# Patient Record
Sex: Female | Born: 1970
Health system: Southern US, Community
[De-identification: ages and names within clinical notes are randomized; demographics above are authoritative.]

## PROBLEM LIST (undated history)

## (undated) DIAGNOSIS — Z800 Family history of malignant neoplasm of digestive organs: Secondary | ICD-10-CM

## (undated) HISTORY — DX: Family history of malignant neoplasm of digestive organs: Z80.0

---

## 1998-01-19 ENCOUNTER — Other Ambulatory Visit: Admission: RE | Admit: 1998-01-19 | Discharge: 1998-01-19 | Payer: Self-pay | Admitting: *Deleted

## 2005-04-20 ENCOUNTER — Ambulatory Visit: Payer: Self-pay | Admitting: Internal Medicine

## 2007-02-08 ENCOUNTER — Ambulatory Visit: Payer: Self-pay | Admitting: Internal Medicine

## 2007-04-07 ENCOUNTER — Emergency Department: Payer: Self-pay | Admitting: Emergency Medicine

## 2007-04-09 ENCOUNTER — Encounter: Payer: Self-pay | Admitting: Maternal & Fetal Medicine

## 2007-10-03 ENCOUNTER — Observation Stay: Payer: Self-pay

## 2007-10-17 ENCOUNTER — Ambulatory Visit: Payer: Self-pay | Admitting: Obstetrics & Gynecology

## 2007-10-18 ENCOUNTER — Inpatient Hospital Stay: Payer: Self-pay | Admitting: Obstetrics & Gynecology

## 2010-06-07 ENCOUNTER — Ambulatory Visit: Payer: Self-pay | Admitting: Obstetrics & Gynecology

## 2015-03-12 ENCOUNTER — Emergency Department
Admission: EM | Admit: 2015-03-12 | Discharge: 2015-03-12 | Disposition: A | Discharge status: Home / Self Care | Payer: BLUE CROSS/BLUE SHIELD | Attending: Emergency Medicine | Admitting: Emergency Medicine

## 2015-03-12 ENCOUNTER — Emergency Department: Payer: BLUE CROSS/BLUE SHIELD

## 2015-03-12 ENCOUNTER — Encounter: Payer: Self-pay | Admitting: Emergency Medicine

## 2015-03-12 DIAGNOSIS — R0602 Shortness of breath: Secondary | ICD-10-CM | POA: Insufficient documentation

## 2015-03-12 DIAGNOSIS — R002 Palpitations: Secondary | ICD-10-CM | POA: Insufficient documentation

## 2015-03-12 DIAGNOSIS — F17210 Nicotine dependence, cigarettes, uncomplicated: Secondary | ICD-10-CM | POA: Insufficient documentation

## 2015-03-12 DIAGNOSIS — F1210 Cannabis abuse, uncomplicated: Secondary | ICD-10-CM | POA: Insufficient documentation

## 2015-03-12 DIAGNOSIS — R079 Chest pain, unspecified: Secondary | ICD-10-CM | POA: Diagnosis not present

## 2015-03-12 DIAGNOSIS — R251 Tremor, unspecified: Secondary | ICD-10-CM | POA: Diagnosis not present

## 2015-03-12 DIAGNOSIS — F419 Anxiety disorder, unspecified: Secondary | ICD-10-CM | POA: Diagnosis present

## 2015-03-12 LAB — BASIC METABOLIC PANEL
Anion gap: 11 (ref 5–15)
BUN: 14 mg/dL (ref 6–20)
CALCIUM: 9.1 mg/dL (ref 8.9–10.3)
CO2: 18 mmol/L — AB (ref 22–32)
CREATININE: 0.88 mg/dL (ref 0.44–1.00)
Chloride: 109 mmol/L (ref 101–111)
GLUCOSE: 80 mg/dL (ref 65–99)
Potassium: 4 mmol/L (ref 3.5–5.1)
Sodium: 138 mmol/L (ref 135–145)

## 2015-03-12 LAB — CBC
HEMATOCRIT: 39.9 % (ref 35.0–47.0)
Hemoglobin: 13.6 g/dL (ref 12.0–16.0)
MCH: 30.9 pg (ref 26.0–34.0)
MCHC: 34 g/dL (ref 32.0–36.0)
MCV: 90.9 fL (ref 80.0–100.0)
PLATELETS: 204 10*3/uL (ref 150–440)
RBC: 4.39 MIL/uL (ref 3.80–5.20)
RDW: 14.3 % (ref 11.5–14.5)
WBC: 4.9 10*3/uL (ref 3.6–11.0)

## 2015-03-12 LAB — URINE DRUG SCREEN, QUALITATIVE (ARMC ONLY)
Amphetamines, Ur Screen: NOT DETECTED
BARBITURATES, UR SCREEN: NOT DETECTED
BENZODIAZEPINE, UR SCRN: NOT DETECTED
CANNABINOID 50 NG, UR ~~LOC~~: POSITIVE — AB
Cocaine Metabolite,Ur ~~LOC~~: NOT DETECTED
MDMA (Ecstasy)Ur Screen: NOT DETECTED
Methadone Scn, Ur: NOT DETECTED
OPIATE, UR SCREEN: NOT DETECTED
PHENCYCLIDINE (PCP) UR S: NOT DETECTED
Tricyclic, Ur Screen: NOT DETECTED

## 2015-03-12 LAB — URINALYSIS COMPLETE WITH MICROSCOPIC (ARMC ONLY)
BILIRUBIN URINE: NEGATIVE
GLUCOSE, UA: NEGATIVE mg/dL
HGB URINE DIPSTICK: NEGATIVE
Ketones, ur: NEGATIVE mg/dL
Leukocytes, UA: NEGATIVE
NITRITE: NEGATIVE
PH: 6 (ref 5.0–8.0)
PROTEIN: NEGATIVE mg/dL
SPECIFIC GRAVITY, URINE: 1.016 (ref 1.005–1.030)

## 2015-03-12 LAB — TSH: TSH: 0.721 u[IU]/mL (ref 0.350–4.500)

## 2015-03-12 MED ORDER — LORAZEPAM 1 MG PO TABS
1.0000 mg | ORAL_TABLET | Freq: Once | ORAL | Status: AC
  Administered 2015-03-12: 1 mg via ORAL
  Filled 2015-03-12: qty 1

## 2015-03-12 NOTE — ED Notes (Signed)
Pt aware of need for urine  

## 2015-03-12 NOTE — ED Provider Notes (Signed)
[facility] Emergency Department Provider Note  ____________________________________________  Time seen: Approximately 10:22 AM  I have reviewed the triage vital signs and the nursing notes.   HISTORY  Chief Complaint Panic Attack    HPI Suzanne Mann is a 44 y.o. female who is otherwise healthy presenting with panic attack. Patient states that she was in her usual state of health, dropped the kids off at school and then in the car began to feel very shaky. She felt like maybe she needed to eat or drink something, so she had breakfast and her symptoms did not improve. She then developed pain in her "boob" that was associated with shortness of breath and worsening feelings of nervousness.She does not have a history of panic attacks, has had no obvious stressor in her life, and has no family history of panic attacks. She states that she is able to slow her breathing down and calm herself down successfully. She does smoke marijuana, last use 2 nights ago and did not have any symptoms at that time. She denies cocaine.   Family history: Negative for anxiety or panic attacks  Social history: Positive for frequent marijuana use. History reviewed. No pertinent past medical history.  There are no active problems to display for this patient.   Past Surgical History  Procedure Laterality Date  . Cesarean section      No current outpatient prescriptions on file.  Allergies Review of patient's allergies indicates no known allergies.  No family history on file.  Social History Social History  Substance Use Topics  . Smoking status: Current Every Day Smoker -- 0.50 packs/day    Types: Cigarettes  . Smokeless tobacco: None  . Alcohol Use: Yes     Comment: occas    Review of Systems Constitutional: No fever/chills Eyes: No visual changes. ENT: No sore throat. Cardiovascular: Positive chest pain, palpitations. Respiratory: Positive shortness of breath.  No  cough. Gastrointestinal: No abdominal pain.  No nausea, no vomiting.  No diarrhea.  No constipation. Genitourinary: Negative for dysuria. Musculoskeletal: Negative for back pain. Skin: Negative for rash. Neurological: Negative for headaches, focal weakness or numbness.  10-point ROS otherwise negative.  ____________________________________________   PHYSICAL EXAM:  VITAL SIGNS: ED Triage Vitals  Enc Vitals Group     BP 03/12/15 0944 149/91 mmHg     Pulse Rate 03/12/15 0944 90     Resp 03/12/15 0944 18     Temp 03/12/15 0944 97.7 F (36.5 C)     Temp Source 03/12/15 0944 Oral     SpO2 03/12/15 0944 100 %     Weight 03/12/15 0944 134 lb (60.782 kg)     Height 03/12/15 0944 5\' 6"  (1.676 m)     Head Cir --      Peak Flow --      Pain Score 03/12/15 0944 5     Pain Loc --      Pain Edu? --      Excl. in [place]? --     Constitutional: Patient is alert and oriented and answering questions appropriate. She is anxious and appears shaky.  Eyes: Conjunctivae are normal.  EOMI.No scleral icterus.  Head: Atraumatic. Nose: No congestion/rhinnorhea. Mouth/Throat: Mucous membranes are moist.  Neck: No stridor.  Supple.  No palpable goiter.  Cardiovascular: Normal rate, regular rhythm. No murmurs, rubs or gallops.  Respiratory:Patient is tachypnea without accessory muscle use or retractions. She is able to speak in full sentences. She is able to slow her  breathing down. Lungs CTAB.  No wheezes, rales or ronchi. Gastrointestinal: Soft and nontender. No distention. No peritoneal signs. Musculoskeletal: No LE edema.  Neurologic:  Normal speech and language. No gross focal neurologic deficits are appreciated.  Skin:  Skin is warm, dry and intact. No rash noted. Psychiatric: Anxious mood with normal affect. Speech and behavior are normal.  Normal judgement and insight  ____________________________________________   LABS (all labs ordered are listed, but only abnormal results are  displayed)  Labs Reviewed  BASIC METABOLIC PANEL - Abnormal; Notable for the following:    CO2 18 (*)    All other components within normal limits  URINALYSIS COMPLETEWITH MICROSCOPIC ([facility] ONLY) - Abnormal; Notable for the following:    Color, Urine YELLOW (*)    APPearance CLEAR (*)    Bacteria, UA RARE (*)    Squamous Epithelial / LPF 6-30 (*)    All other components within normal limits  URINE DRUG SCREEN, QUALITATIVE ([facility] ONLY) - Abnormal; Notable for the following:    Cannabinoid 50 Ng, Ur Axis POSITIVE (*)    All other components within normal limits  TSH  CBC  POC URINE PREG, ED   ____________________________________________  EKG  ED ECG REPORT I, Eula Listen, the attending physician, personally viewed and interpreted this ECG.   Date: 03/12/2015  EKG Time: 1034  Rate: 60  Rhythm: normal sinus rhythm  Axis: Normal  Intervals:none  ST&T Change: No ST elevation. Her baseline tracing but this is likely due to the patient's tremulousness from her anxiety.  ____________________________________________  RADIOLOGY  Dg Chest 2 View  03/12/2015  CLINICAL DATA:  Shortness of breath.  Smoker. EXAM: CHEST  2 VIEW COMPARISON:  None. FINDINGS: EKG leads create extensive artifact over the chest. There is hyperinflation and central airway thickening correlating with smoking history. There is no edema, consolidation, effusion, or pneumothorax. Normal heart size and aortic contours. IMPRESSION: 1. No acute finding. 2. Hyperinflation, likely COPD. Electronically Signed   By: Monte Fantasia M.D.   On: 03/12/2015 11:31    ____________________________________________   PROCEDURES  Procedure(s) performed: None  Critical Care performed: No ____________________________________________   INITIAL IMPRESSION / ASSESSMENT AND PLAN / ED COURSE  Pertinent labs & imaging results that were available during my care of the patient were reviewed by me and considered in my  medical decision making (see chart for details).  44 y.o. female, otherwise healthy presenting with feelings of anxiety associated with left boob pain, shortness of breath. Clinically, the patient's symptoms are consistent with anxiety or panic attack, however she does have not have any psychiatric history and does not describe any acute stressors which have could have caused this. I will do a medical examination to rule out thyroid disorder, Y abnormality or acute cardiac event. If the workup is negative she will need follow-up with her primary care physician.  At the time of discharge, the patient was feeling much better. All her feelings of anxiety had resolved. She was no longer having any chest pain or shortness of breath. Her workup was negative for any organic causes of chest pain or shortness of breath. She was discharged with instructions for close PMD follow-up. She understands return precautions as well as follow-up instructions. ____________________________________________  FINAL CLINICAL IMPRESSION(S) / ED DIAGNOSES  Final diagnoses:  Anxiety  Chest pain, unspecified chest pain type  Shakiness      NEW MEDICATIONS STARTED DURING THIS VISIT:  There are no discharge medications for this patient.  Eula Listen, MD 03/12/15 1544

## 2015-03-12 NOTE — ED Notes (Signed)
Discussed discharge instructions and follow-up care with patient. No questions or concerns at this time. Pt stable at discharge.  

## 2015-03-12 NOTE — Discharge Instructions (Signed)
Please call the [facility] clinic to establish a primary care physician follow-up the symptoms that he had today.  Please return to the emergency department if you develop chest pain, shortness of breath, a panic attack, fever, palpitations, or any other symptoms concerning to you.

## 2015-03-12 NOTE — ED Notes (Signed)
C/o left breast pain and right arm pain when breathing is fast.  Feels like she is having an anxiety attack.  Pain eases when breathing controlled. No hx anxiety or depression. Has never had anxiety attack.  Reports just hasn't been feeling right today.

## 2015-03-12 NOTE — ED Notes (Signed)
Pt states she began feeling sob and anxious this am after dropping her kids off at school. Was sob, "hyperventilating" upon initial assessment. Pt states the back of her neck hurts-husband states she has been really tense. Husband supportive in triage. Pt calming down with proper breathing.

## 2015-03-20 LAB — POCT PREGNANCY, URINE: Preg Test, Ur: NEGATIVE

## 2016-07-01 ENCOUNTER — Encounter: Payer: Self-pay | Admitting: Emergency Medicine

## 2016-07-01 ENCOUNTER — Emergency Department
Admission: EM | Admit: 2016-07-01 | Discharge: 2016-07-01 | Disposition: A | Discharge status: Home / Self Care | Payer: BLUE CROSS/BLUE SHIELD | Attending: Emergency Medicine | Admitting: Emergency Medicine

## 2016-07-01 ENCOUNTER — Emergency Department: Payer: BLUE CROSS/BLUE SHIELD

## 2016-07-01 DIAGNOSIS — J209 Acute bronchitis, unspecified: Secondary | ICD-10-CM | POA: Insufficient documentation

## 2016-07-01 DIAGNOSIS — Z87891 Personal history of nicotine dependence: Secondary | ICD-10-CM | POA: Diagnosis not present

## 2016-07-01 DIAGNOSIS — R05 Cough: Secondary | ICD-10-CM | POA: Diagnosis present

## 2016-07-01 LAB — CBC WITH DIFFERENTIAL/PLATELET
BASOS ABS: 0 10*3/uL (ref 0–0.1)
Basophils Relative: 1 %
Eosinophils Absolute: 0.2 10*3/uL (ref 0–0.7)
Eosinophils Relative: 3 %
HEMATOCRIT: 39.3 % (ref 35.0–47.0)
Hemoglobin: 13.6 g/dL (ref 12.0–16.0)
LYMPHS PCT: 15 %
Lymphs Abs: 1 10*3/uL (ref 1.0–3.6)
MCH: 31.4 pg (ref 26.0–34.0)
MCHC: 34.5 g/dL (ref 32.0–36.0)
MCV: 91.2 fL (ref 80.0–100.0)
MONO ABS: 0.5 10*3/uL (ref 0.2–0.9)
Monocytes Relative: 7 %
NEUTROS ABS: 5.2 10*3/uL (ref 1.4–6.5)
Neutrophils Relative %: 74 %
Platelets: 204 10*3/uL (ref 150–440)
RBC: 4.31 MIL/uL (ref 3.80–5.20)
RDW: 14.1 % (ref 11.5–14.5)
WBC: 6.9 10*3/uL (ref 3.6–11.0)

## 2016-07-01 LAB — TROPONIN I: Troponin I: <0.03 ng/mL (ref <0.03)

## 2016-07-01 LAB — BASIC METABOLIC PANEL
ANION GAP: 8 (ref 5–15)
BUN: 9 mg/dL (ref 6–20)
CHLORIDE: 106 mmol/L (ref 101–111)
CO2: 25 mmol/L (ref 22–32)
Calcium: 9.1 mg/dL (ref 8.9–10.3)
Creatinine, Ser: 0.8 mg/dL (ref 0.44–1.00)
GFR calc Af Amer: >60 mL/min (ref >60)
GLUCOSE: 89 mg/dL (ref 65–99)
POTASSIUM: 3.9 mmol/L (ref 3.5–5.1)
Sodium: 139 mmol/L (ref 135–145)

## 2016-07-01 MED ORDER — ALBUTEROL SULFATE HFA 108 (90 BASE) MCG/ACT IN AERS: 2.0000 | INHALATION_SPRAY | Freq: Four times a day (QID) | RESPIRATORY_TRACT | 0 refills | Status: DC | PRN

## 2016-07-01 MED ORDER — PREDNISONE 10 MG PO TABS: ORAL_TABLET | ORAL | 0 refills | Status: DC

## 2016-07-01 MED ORDER — PREDNISONE 20 MG PO TABS
40.0000 mg | ORAL_TABLET | Freq: Once | ORAL | Status: AC
  Administered 2016-07-01: 40 mg via ORAL
  Filled 2016-07-01: qty 2

## 2016-07-01 MED ORDER — IPRATROPIUM-ALBUTEROL 0.5-2.5 (3) MG/3ML IN SOLN
3.0000 mL | Freq: Once | RESPIRATORY_TRACT | Status: AC
  Administered 2016-07-01: 3 mL via RESPIRATORY_TRACT

## 2016-07-01 MED ORDER — IPRATROPIUM-ALBUTEROL 0.5-2.5 (3) MG/3ML IN SOLN
3.0000 mL | Freq: Once | RESPIRATORY_TRACT | Status: AC
  Administered 2016-07-01: 3 mL via RESPIRATORY_TRACT
  Filled 2016-07-01: qty 3

## 2016-07-01 MED ORDER — IPRATROPIUM-ALBUTEROL 0.5-2.5 (3) MG/3ML IN SOLN
RESPIRATORY_TRACT | Status: AC
  Administered 2016-07-01: 3 mL via RESPIRATORY_TRACT
  Filled 2016-07-01: qty 3

## 2016-07-01 NOTE — ED Notes (Signed)
Pt SpO2 consistent 94% while ambulating in hall. Pt denies dizziness or lightheadedness while walking.

## 2016-07-01 NOTE — ED Provider Notes (Signed)
[facility] Emergency Department Provider Note ____________________________________________   I have reviewed the triage vital signs and the triage nursing note.  HISTORY  Chief Complaint Cough; Nasal Congestion; and Shortness of Breath   Historian Patient  HPI Suzanne Mann is a 46 y.o. female presenting for dyspnea and wheezing and trouble breathing. Nonproductive cough. Symptoms for several days now almost week. States that she was sent over from walk-in clinic because her O2 sat was low, around 91% on room air.  No history wheezing or asthma. She has a history of smoking previously, she currently is a nonsmoker.  No fever. No nausea or vomiting or diarrhea. No abdominal pain. No chest pain.    History reviewed. No pertinent past medical history.  There are no active problems to display for this patient.   Past Surgical History:  Procedure Laterality Date  . CESAREAN SECTION    . CESAREAN SECTION      Prior to Admission medications   Medication Sig Start Date End Date Taking? Authorizing Provider  albuterol (PROVENTIL HFA;VENTOLIN HFA) 108 (90 Base) MCG/ACT inhaler Inhale 2 puffs into the lungs every 6 (six) hours as needed for wheezing or shortness of breath. 07/01/16   Lisa Roca, MD  predniSONE (DELTASONE) 10 MG tablet 40mg  daily for 4 more days 07/01/16   Lisa Roca, MD    No Known Allergies  No family history on file.  Social History Social History  Substance Use Topics  . Smoking status: Former Smoker    Packs/day: 0.50    Types: Cigarettes  . Smokeless tobacco: Not on file  . Alcohol use Yes     Comment: occas    Review of Systems  Constitutional: Negative for fever. Eyes: Negative for visual changes. ENT: Negative for sore throat. Cardiovascular: Negative for chest pain. Respiratory: Positive for shortness of breath. Gastrointestinal: Negative for abdominal pain, vomiting and diarrhea. Genitourinary: Negative for  dysuria. Musculoskeletal: Negative for back pain. Skin: Negative for rash. Neurological: Negative for headache. 10 point Review of Systems otherwise negative ____________________________________________   PHYSICAL EXAM:  VITAL SIGNS: ED Triage Vitals  Enc Vitals Group     BP 07/01/16 0915 (!) 141/90     Pulse Rate 07/01/16 0915 87     Resp 07/01/16 0915 20     Temp 07/01/16 0915 98.4 F (36.9 C)     Temp Source 07/01/16 0915 Oral     SpO2 07/01/16 0915 95 %     Weight 07/01/16 0916 137 lb (62.1 kg)     Height 07/01/16 0916 5\' 6"  (1.676 m)     Head Circumference --      Peak Flow --      Pain Score 07/01/16 0914 3     Pain Loc --      Pain Edu? --      Excl. in [place]? --      Constitutional: Alert and oriented. Well appearing and in no distress. HEENT   Head: Normocephalic and atraumatic.      Eyes: Conjunctivae are normal. PERRL. Normal extraocular movements.      Ears:         Nose: No congestion/rhinnorhea.   Mouth/Throat: Mucous membranes are moist.   Neck: No stridor. Cardiovascular/Chest: Normal rate, regular rhythm.  No murmurs, rubs, or gallops. Respiratory: Normal respiratory effort without tachypnea nor retractions. Moderate end-expiratory wheezing in all fields. Mild rhonchi without rales. Gastrointestinal: Soft. No distention, no guarding, no rebound. Nontender.    Genitourinary/rectal:Deferred Musculoskeletal: Nontender  with normal range of motion in all extremities. No joint effusions.  No lower extremity tenderness.  No edema. Neurologic:  Normal speech and language. No gross or focal neurologic deficits are appreciated. Skin:  Skin is warm, dry and intact. No rash noted. Psychiatric: Mood and affect are normal. Speech and behavior are normal. Patient exhibits appropriate insight and judgment.   ____________________________________________  LABS (pertinent positives/negatives)  Labs Reviewed  BASIC METABOLIC PANEL  CBC WITH  DIFFERENTIAL/PLATELET  TROPONIN I    ____________________________________________    EKG I, Lisa Roca, MD, the attending physician have personally viewed and interpreted all ECGs.  86 bpm. Normal sinus rhythm. Narrow QRS. Normal axis. No ST and T-wave ____________________________________________  RADIOLOGY All Xrays were viewed by me. Imaging interpreted by Radiologist.  Chest x-ray two-view: No acute cardio pulmonary disease. __________________________________________  PROCEDURES  Procedure(s) performed: None  Critical Care performed: None  ____________________________________________   ED COURSE / ASSESSMENT AND PLAN  Pertinent labs & imaging results that were available during my care of the patient were reviewed by me and considered in my medical decision making (see chart for details).   Ms. Lozada is here with shortness of breath and found to have wheezing and O2 sat around 91%. Improvement sound with DuoNeb and a second DuoNeb. I'm also going to put her on a burst of steroid.  She reported some occasional chest discomfort centrally, it sounds to me most likely related to the bronchospasm and tightness, however I did check laboratory studies in addition to the EKG.  Laboratory studies EKG and chest x-ray are all reassuring overall.  Walking O2 sat reassuring. OK for discharge home - referred to [facility], states pcp has been her ob gyn.  CONSULTATIONS:  None   Patient / Family / Caregiver informed of clinical course, medical decision-making process, and agree with plan.   I discussed return precautions, follow-up instructions, and discharge instructions with patient and/or family.  Discharge instructions:  You were evaluated for wheezing trouble breathing and are being treated for wheezing also called bronco spasm home with bronchitis.  Return to the emergency department immediately for any worsening trouble breathing, shortness breath, chest pain, pain with  breathing, fever, heart racing, dizziness or passing out, weakness, numbness, or any other symptoms concerning to you.  ___________________________________________   FINAL CLINICAL IMPRESSION(S) / ED DIAGNOSES   Final diagnoses:  Bronchospasm with bronchitis, acute              Note: This dictation was prepared with Dragon dictation. Any transcriptional errors that result from this process are unintentional    Lisa Roca, MD 07/01/16 [phone]

## 2016-07-01 NOTE — ED Triage Notes (Signed)
Patient presents to the ED from [facility] for shortness of breath since yesterday evening after having cold/congestion symptoms for a few days.  Patient states, "I thought it was sinuses, but last night I had trouble catching my breath."  Patient is speaking in somewhat short sentences but is showing no signs of distress at this time.  Patient is tachypnic.

## 2016-07-01 NOTE — ED Notes (Signed)
Pt reports productive cough, chest and nasal congestion, and shortness of breath. Pt states that last night she started having trouble catching her breath. Pt has tried OTC medication without relief.

## 2016-07-01 NOTE — Discharge Instructions (Signed)
You were evaluated for wheezing trouble breathing and are being treated for wheezing also called bronco spasm home with bronchitis.  Return to the emergency department immediately for any worsening trouble breathing, shortness breath, chest pain, pain with breathing, fever, heart racing, dizziness or passing out, weakness, numbness, or any other symptoms concerning to you.

## 2018-03-26 ENCOUNTER — Other Ambulatory Visit: Payer: Self-pay

## 2018-03-26 ENCOUNTER — Emergency Department
Admission: EM | Admit: 2018-03-26 | Discharge: 2018-03-26 | Disposition: A | Discharge status: Home / Self Care | Payer: BLUE CROSS/BLUE SHIELD | Attending: Emergency Medicine | Admitting: Emergency Medicine

## 2018-03-26 ENCOUNTER — Encounter: Payer: Self-pay | Admitting: Emergency Medicine

## 2018-03-26 ENCOUNTER — Emergency Department: Payer: BLUE CROSS/BLUE SHIELD

## 2018-03-26 DIAGNOSIS — F17210 Nicotine dependence, cigarettes, uncomplicated: Secondary | ICD-10-CM | POA: Insufficient documentation

## 2018-03-26 DIAGNOSIS — N200 Calculus of kidney: Secondary | ICD-10-CM | POA: Insufficient documentation

## 2018-03-26 DIAGNOSIS — R109 Unspecified abdominal pain: Secondary | ICD-10-CM | POA: Diagnosis present

## 2018-03-26 LAB — BASIC METABOLIC PANEL
Anion gap: 11 (ref 5–15)
BUN: 11 mg/dL (ref 6–20)
CALCIUM: 9.8 mg/dL (ref 8.9–10.3)
CO2: 22 mmol/L (ref 22–32)
Chloride: 103 mmol/L (ref 98–111)
Creatinine, Ser: 0.97 mg/dL (ref 0.44–1.00)
GFR calc Af Amer: >60 mL/min (ref >60)
GFR calc non Af Amer: >60 mL/min (ref >60)
GLUCOSE: 112 mg/dL — AB (ref 70–99)
Potassium: 3.3 mmol/L — ABNORMAL LOW (ref 3.5–5.1)
Sodium: 136 mmol/L (ref 135–145)

## 2018-03-26 LAB — URINALYSIS, COMPLETE (UACMP) WITH MICROSCOPIC
BILIRUBIN URINE: NEGATIVE
Glucose, UA: 150 mg/dL — AB
Ketones, ur: NEGATIVE mg/dL
Leukocytes, UA: NEGATIVE
Nitrite: NEGATIVE
PROTEIN: NEGATIVE mg/dL
Specific Gravity, Urine: 1.01 (ref 1.005–1.030)
pH: 7 (ref 5.0–8.0)

## 2018-03-26 LAB — CBC
HCT: 43.9 % (ref 36.0–46.0)
Hemoglobin: 14.9 g/dL (ref 12.0–15.0)
MCH: 29.8 pg (ref 26.0–34.0)
MCHC: 33.9 g/dL (ref 30.0–36.0)
MCV: 87.8 fL (ref 80.0–100.0)
Platelets: 274 10*3/uL (ref 150–400)
RBC: 5 MIL/uL (ref 3.87–5.11)
RDW: 14 % (ref 11.5–15.5)
WBC: 7.3 10*3/uL (ref 4.0–10.5)
nRBC: 0 % (ref 0.0–0.2)

## 2018-03-26 LAB — POCT PREGNANCY, URINE: Preg Test, Ur: NEGATIVE

## 2018-03-26 MED ORDER — SODIUM CHLORIDE 0.9 % IV BOLUS
1000.0000 mL | Freq: Once | INTRAVENOUS | Status: AC
  Administered 2018-03-26: 1000 mL via INTRAVENOUS

## 2018-03-26 MED ORDER — NAPROXEN 500 MG PO TABS: 500.0000 mg | ORAL_TABLET | Freq: Two times a day (BID) | ORAL | 2 refills | Status: DC

## 2018-03-26 MED ORDER — ONDANSETRON HCL 4 MG/2ML IJ SOLN
INTRAMUSCULAR | Status: AC
  Administered 2018-03-26: 4 mg via INTRAVENOUS
  Filled 2018-03-26: qty 2

## 2018-03-26 MED ORDER — HYDROCODONE-ACETAMINOPHEN 5-325 MG PO TABS: 1.0000 | ORAL_TABLET | ORAL | 0 refills | Status: DC | PRN

## 2018-03-26 MED ORDER — CEPHALEXIN 500 MG PO CAPS: 500.0000 mg | ORAL_CAPSULE | Freq: Two times a day (BID) | ORAL | 0 refills | Status: DC

## 2018-03-26 MED ORDER — FENTANYL CITRATE (PF) 100 MCG/2ML IJ SOLN
INTRAMUSCULAR | Status: AC
  Administered 2018-03-26: 50 ug via INTRAVENOUS
  Filled 2018-03-26: qty 2

## 2018-03-26 MED ORDER — KETOROLAC TROMETHAMINE 30 MG/ML IJ SOLN
30.0000 mg | Freq: Once | INTRAMUSCULAR | Status: AC
  Administered 2018-03-26: 30 mg via INTRAVENOUS
  Filled 2018-03-26: qty 1

## 2018-03-26 MED ORDER — ONDANSETRON HCL 4 MG/2ML IJ SOLN
4.0000 mg | Freq: Once | INTRAMUSCULAR | Status: AC
  Administered 2018-03-26: 4 mg via INTRAVENOUS

## 2018-03-26 MED ORDER — FENTANYL CITRATE (PF) 100 MCG/2ML IJ SOLN
50.0000 ug | Freq: Once | INTRAMUSCULAR | Status: AC
  Administered 2018-03-26: 50 ug via INTRAVENOUS

## 2018-03-26 NOTE — ED Triage Notes (Signed)
Pt here with c/o left flank pain for 2 days now, today however, much worse. States blood in her urine this am and burning with urination. Tearful in triage, denies hx of kidney stones.

## 2018-03-26 NOTE — ED Provider Notes (Signed)
[facility] Emergency Department Provider Note   ____________________________________________    I have reviewed the triage vital signs and the nursing notes.   HISTORY  Chief Complaint Flank Pain     HPI Suzanne Mann is a 47 y.o. female who presents with complaints of left-sided flank pain.  Patient reports symptoms started yesterday as an aching sensation in her left flank.  The pain has significantly worsened today with radiation into her left groin.  She denies hematuria today.  Denies dysuria.  No fevers or chills.  No nausea or vomiting.  Has not taken anything for this.  No history of kidney stones.  Nothing seems to make it worse   History reviewed. No pertinent past medical history.  There are no active problems to display for this patient.   Past Surgical History:  Procedure Laterality Date  . CESAREAN SECTION    . CESAREAN SECTION      Prior to Admission medications   Medication Sig Start Date End Date Taking? Authorizing Provider  albuterol (PROVENTIL HFA;VENTOLIN HFA) 108 (90 Base) MCG/ACT inhaler Inhale 2 puffs into the lungs every 6 (six) hours as needed for wheezing or shortness of breath. 07/01/16   Lisa Roca, MD  cephALEXin (KEFLEX) 500 MG capsule Take 1 capsule (500 mg total) by mouth 2 (two) times daily. 03/26/18   Lavonia Drafts, MD  HYDROcodone-acetaminophen (NORCO/VICODIN) 5-325 MG tablet Take 1 tablet by mouth every 4 (four) hours as needed for moderate pain. 03/26/18   Lavonia Drafts, MD  naproxen (NAPROSYN) 500 MG tablet Take 1 tablet (500 mg total) by mouth 2 (two) times daily with a meal. 03/26/18   Lavonia Drafts, MD  predniSONE (DELTASONE) 10 MG tablet 40mg  daily for 4 more days 07/01/16   Lisa Roca, MD     Allergies Patient has no known allergies.  No family history on file.  Social History Social History   Tobacco Use  . Smoking status: Current Every Day Smoker    Packs/day: 0.50    Types:  Cigarettes  . Smokeless tobacco: Never Used  Substance Use Topics  . Alcohol use: Yes    Comment: occas  . Drug use: Not on file    Review of Systems  Constitutional: No fever/chills Eyes: No visual changes.  ENT: No sore throat. Cardiovascular: Denies chest pain. Respiratory: Denies shortness of breath. Gastrointestinal: As above Genitourinary: No dysuria, no frequency, positive hematuria Musculoskeletal: Negative for back pain. Skin: Negative for rash. Neurological: Negative for headaches    ____________________________________________   PHYSICAL EXAM:  VITAL SIGNS: ED Triage Vitals  Enc Vitals Group     BP 03/26/18 1546 (!) 168/97     Pulse Rate 03/26/18 1546 76     Resp 03/26/18 1546 18     Temp 03/26/18 1546 (!) 97.5 F (36.4 C)     Temp Source 03/26/18 1546 Oral     SpO2 03/26/18 1546 99 %     Weight 03/26/18 1547 63.5 kg (140 lb)     Height 03/26/18 1547 1.676 m (5\' 6" )     Head Circumference --      Peak Flow --      Pain Score 03/26/18 1547 10     Pain Loc --      Pain Edu? --      Excl. in [place]? --     Constitutional: Alert and oriented. No acute distress. Eyes: Conjunctivae are normal.   Nose: No congestion/rhinnorhea. Mouth/Throat: Mucous membranes are  moist.    Cardiovascular: Normal rate, regular rhythm.  Good peripheral circulation. Respiratory: Normal respiratory effort.  No retractions.  Gastrointestinal: Soft and nontender. No distention.  No CVA tenderness.  Musculoskeletal:  Warm and well perfused Neurologic:  Normal speech and language. No gross focal neurologic deficits are appreciated.  Skin:  Skin is warm, dry and intact. No rash noted. Psychiatric: Mood and affect are normal. Speech and behavior are normal.  ____________________________________________   LABS (all labs ordered are listed, but only abnormal results are displayed)  Labs Reviewed  URINALYSIS, COMPLETE (UACMP) WITH MICROSCOPIC - Abnormal; Notable for the following  components:      Result Value   Color, Urine YELLOW (*)    APPearance HAZY (*)    Glucose, UA 150 (*)    Hgb urine dipstick LARGE (*)    RBC / HPF >50 (*)    Bacteria, UA MANY (*)    All other components within normal limits  BASIC METABOLIC PANEL - Abnormal; Notable for the following components:   Potassium 3.3 (*)    Glucose, Bld 112 (*)    All other components within normal limits  CBC  POC URINE PREG, ED  POCT PREGNANCY, URINE   ____________________________________________  EKG  None ____________________________________________  RADIOLOGY  T renal stone study ____________________________________________   PROCEDURES  Procedure(s) performed: No  Procedures   Critical Care performed: No ____________________________________________   INITIAL IMPRESSION / ASSESSMENT AND PLAN / ED COURSE  Pertinent labs & imaging results that were available during my care of the patient were reviewed by me and considered in my medical decision making (see chart for details).  Patient presents with left flank pain rating to her left groin, strongly suspicious for ureterolithiasis.  Will give IV Toradol along with the IV fentanyl she has already received.  Check labs, urinalysis obtain CT renal stone study and reevaluate.  CT scan demonstrates 4 mm left UVJ stone.  Overall the patient is quite well-appearing in no acute distress, normal white blood cell count, urinalysis demonstrates 21-50 white blood cells negative leukocytes negative nitrites positive bacteria, discussed with Dr. Gloriann Loan of urology who recommends discharge with p.o. Keflex.  Patient continues to feel well, strict return precautions discussed including chills, fever, worsening pain     ____________________________________________   FINAL CLINICAL IMPRESSION(S) / ED DIAGNOSES  Final diagnoses:  Kidney stone        Note:  This document was prepared using Dragon voice recognition software and may include  unintentional dictation errors.    Lavonia Drafts, MD 03/26/18 [phone]

## 2018-03-26 NOTE — ED Notes (Signed)
Pt verbalizes understanding of d/c instructions, medications and follow up 

## 2018-03-26 NOTE — ED Notes (Signed)
Pt reports Saturday started with pain to left lower back that now radiates around to her left flank and left groin. Pt reports some nausea as well.

## 2018-05-04 ENCOUNTER — Encounter

## 2018-05-04 ENCOUNTER — Ambulatory Visit: Payer: Self-pay | Admitting: Urology

## 2020-05-01 IMAGING — CT CT RENAL STONE PROTOCOL
2 of 4 series · 16 of 46 positions shown, 18 images · non-contrast
Comparison: None.

CLINICAL DATA: 47-year-old female with left flank pain for 2 days,
today much worse. Blood in urine. Burning with urination. Initial
encounter.

EXAM:
CT ABDOMEN AND PELVIS WITHOUT CONTRAST
TECHNIQUE: Multidetector CT imaging of the abdomen and pelvis was performed
following the standard protocol without IV contrast.

[Series 2: stone full standard · axial · 0.65mm/px · z∈[-413,-3]mm · 13 of 90 slices shown, 15 images]
[im 4/90  soft-tissue]
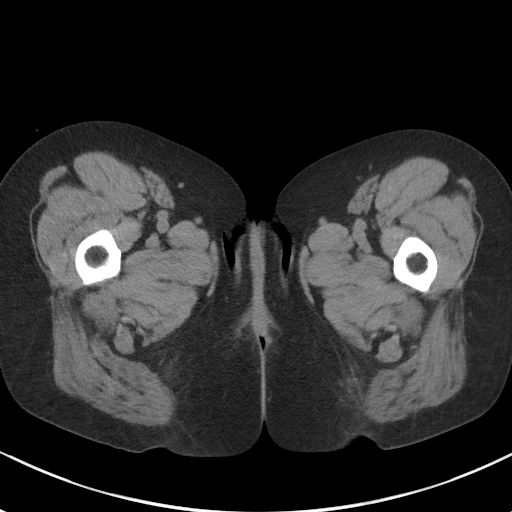
[im 4/90  bone]
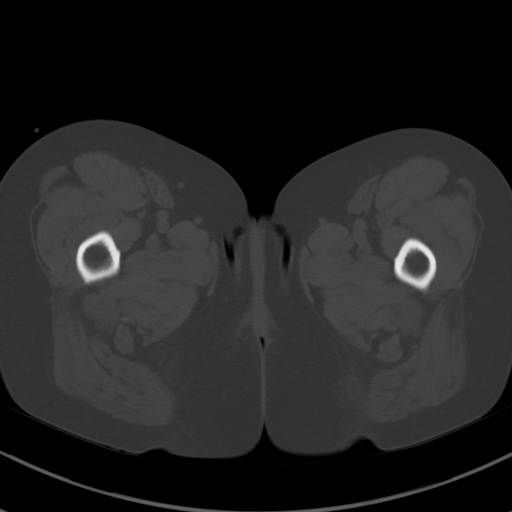
[im 12/90  soft-tissue]
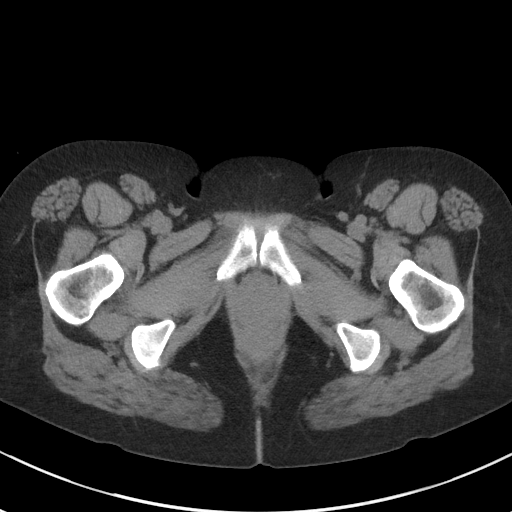
[im 19/90  soft-tissue]
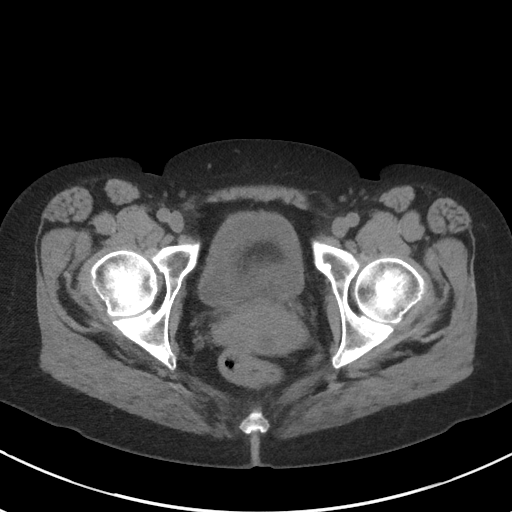
[im 26/90  soft-tissue]
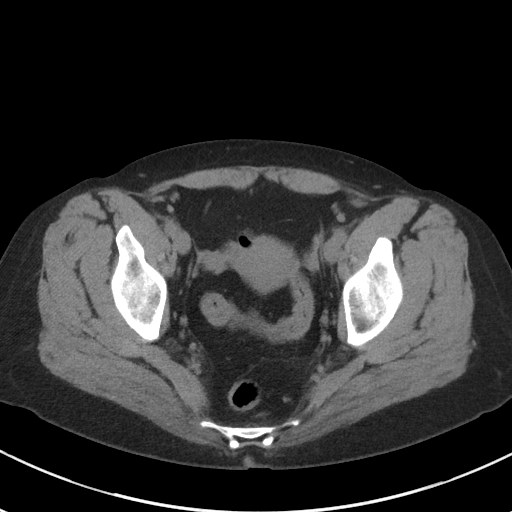
[im 30/90  soft-tissue]
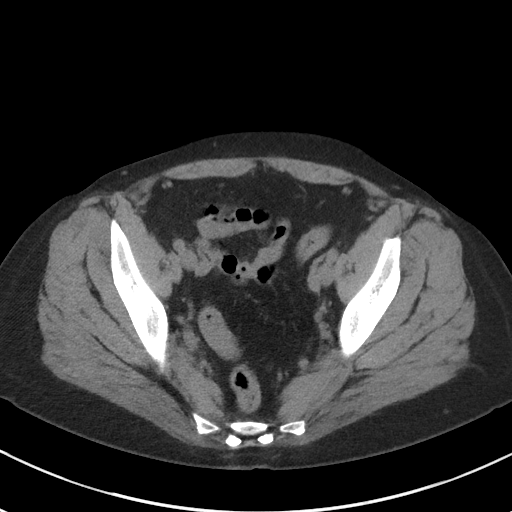
[im 38/90  soft-tissue]
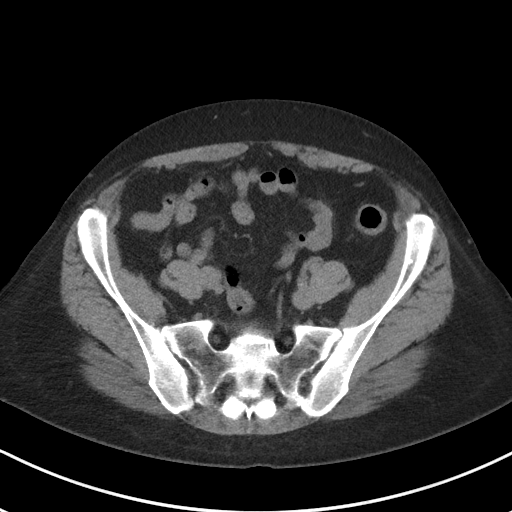
[im 45/90  soft-tissue]
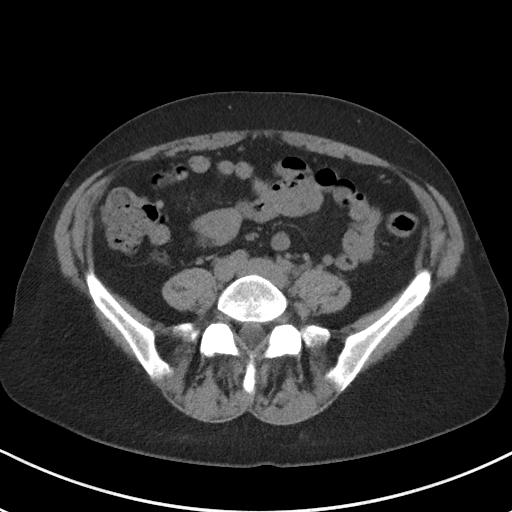
[im 52/90  soft-tissue]
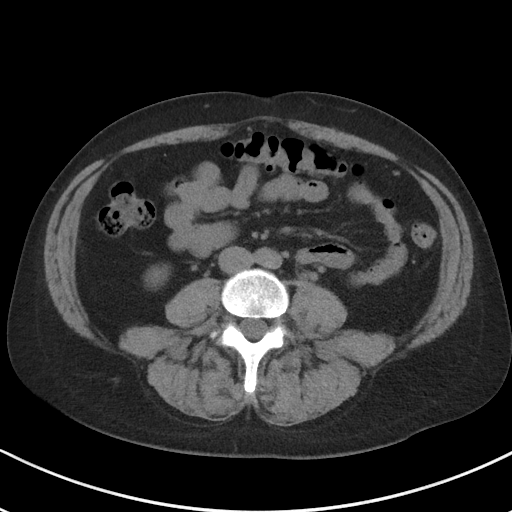
[im 60/90  soft-tissue]
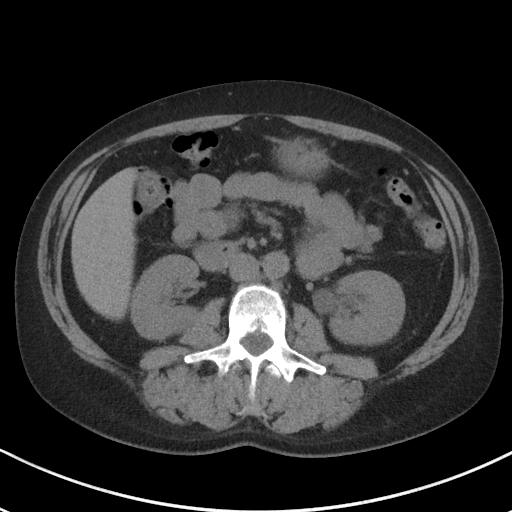
[im 60/90  bone]
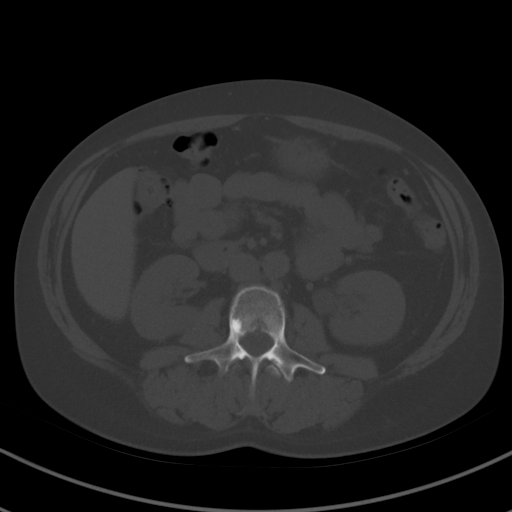
[im 64/90  soft-tissue]
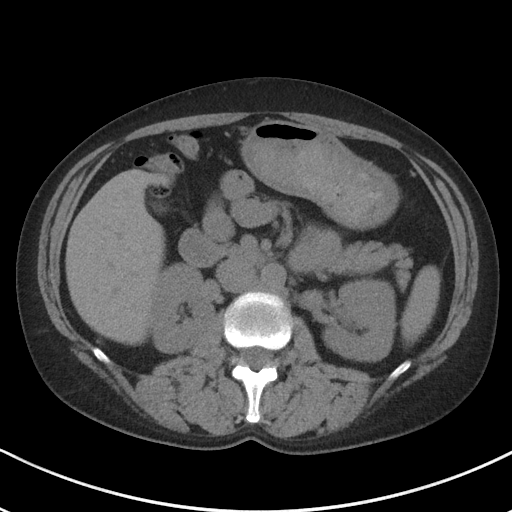
[im 71/90  soft-tissue]
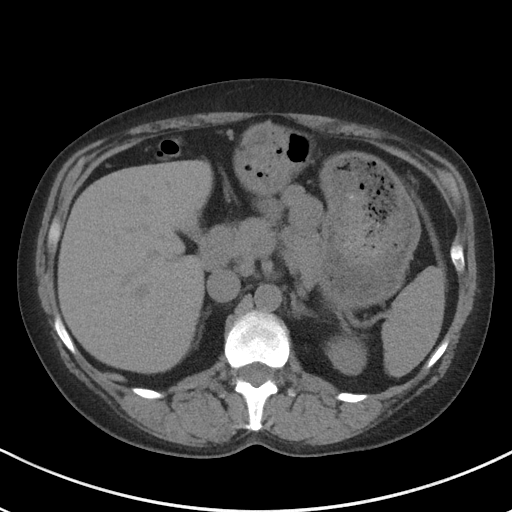
[im 78/90  soft-tissue]
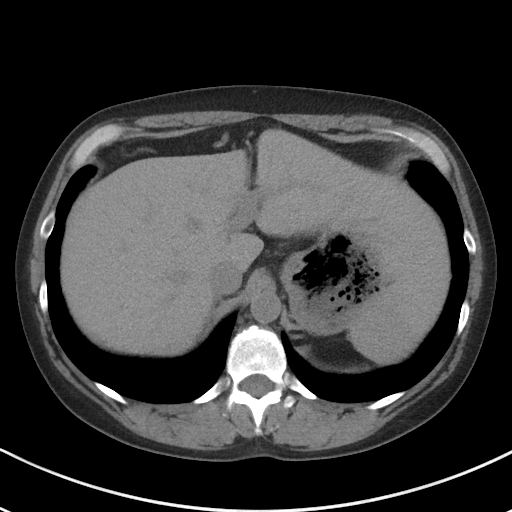
[im 86/90  soft-tissue]
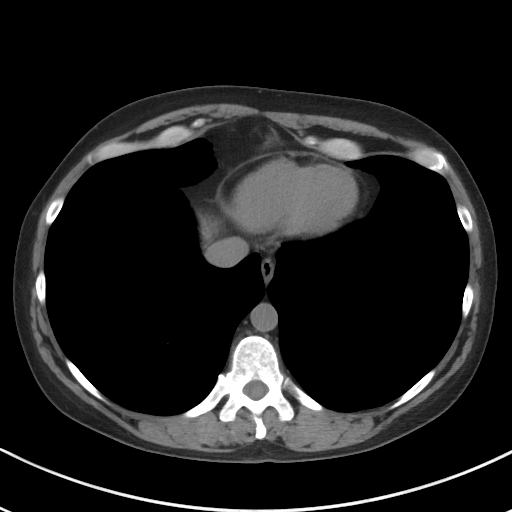

[Series 5: coronal · coronal · 0.73mm/px · 3 of 125 slices shown]
[im 42/125  soft-tissue]
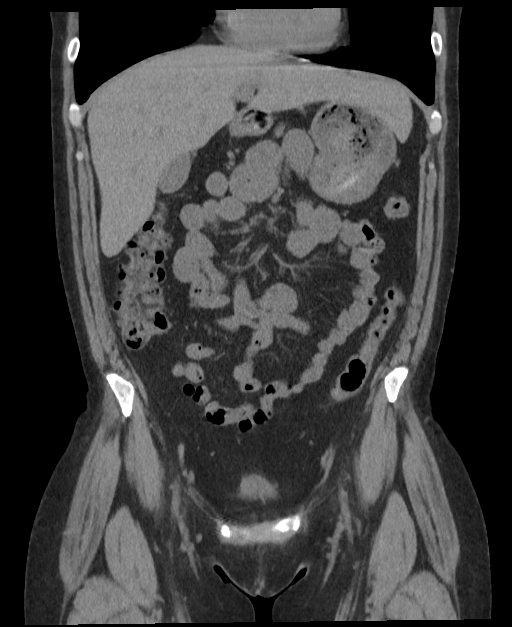
[im 56/125  soft-tissue]
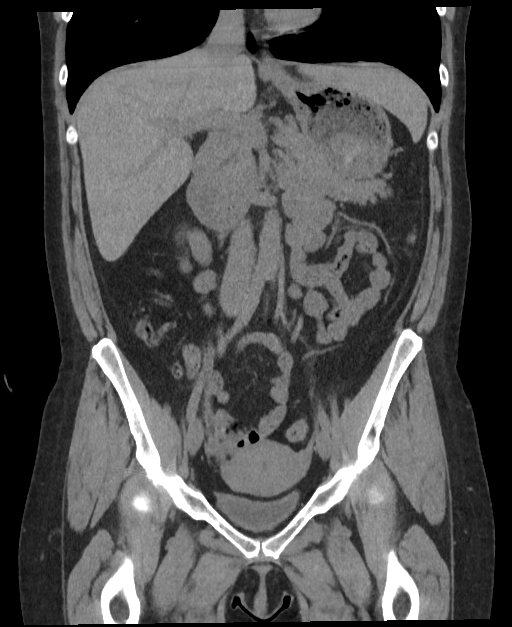
[im 69/125  soft-tissue]
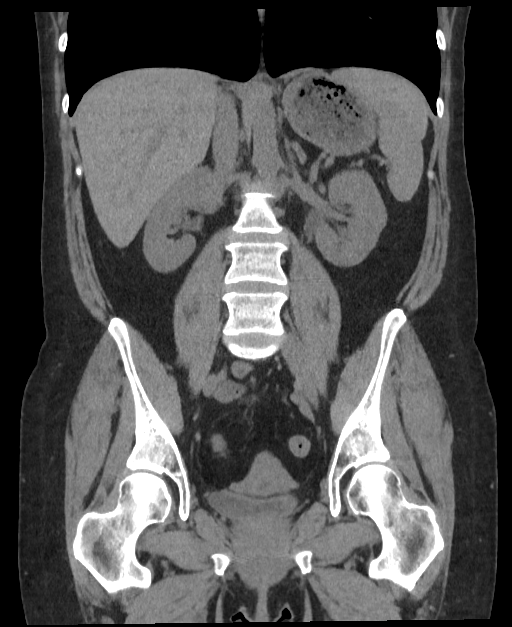

[16 of 46 positions shown; findings below may reference images not displayed]

FINDINGS: Lower chest: Minimal lung base scarring. Heart size within normal
limits.

Hepatobiliary: Elongated left lobe liver. Taking into account
limitation by non contrast imaging, no worrisome hepatic lesion.

Possible gallbladder sludge. No calcified gallstone or common bile
duct stone. No CT evidence of gallbladder inflammation.

Pancreas: Taking into account limitation by non contrast imaging, no
worrisome hepatic lesion or inflammation.

Spleen: Taking into account limitation by non contrast imaging, no
splenic lesion or enlargement.

Adrenals/Urinary Tract: 4 mm distal left ureteral/ureteral vesicle
junction obstructing stone with mild to moderate left
hydroureteronephrosis.

Taking into account limitation by non contrast imaging, no worrisome
renal, adrenal or urinary bladder lesion.

Stomach/Bowel: No extraluminal bowel inflammatory process. Portions
of the stomach, small bowel and colon are under distended slightly
limiting evaluation.

Vascular/Lymphatic: Trace aortic calcification. No abdominal aortic
aneurysm.

Scattered normal size lymph nodes.

Reproductive: Slightly lobulated uterus suggestive of small
fibroids. No worrisome adnexal mass.

Other: No free air or bowel containing hernia.

Musculoskeletal: 12 probable bone island right aspect L3 vertebra
pedicle junction.
IMPRESSION: 1. 4 mm distal left ureteral/ureteral vesicle junction obstructing
stone with mild to moderate left hydroureteronephrosis.
2. Slightly lobulated uterus suggestive of small fibroids.

## 2021-02-03 ENCOUNTER — Other Ambulatory Visit (HOSPITAL_COMMUNITY)
Admission: RE | Admit: 2021-02-03 | Discharge: 2021-02-03 | Disposition: A | Discharge status: Home / Self Care | Payer: BLUE CROSS/BLUE SHIELD | Source: Ambulatory Visit | Attending: Obstetrics & Gynecology | Admitting: Obstetrics & Gynecology

## 2021-02-03 ENCOUNTER — Encounter: Payer: Self-pay | Admitting: Obstetrics & Gynecology

## 2021-02-03 ENCOUNTER — Ambulatory Visit (INDEPENDENT_AMBULATORY_CARE_PROVIDER_SITE_OTHER): Payer: BC Managed Care – PPO | Admitting: Obstetrics & Gynecology

## 2021-02-03 ENCOUNTER — Other Ambulatory Visit: Payer: Self-pay

## 2021-02-03 VITALS — BP 120/80 | Ht 66.0 in | Wt 132.0 lb

## 2021-02-03 DIAGNOSIS — Z1211 Encounter for screening for malignant neoplasm of colon: Secondary | ICD-10-CM

## 2021-02-03 DIAGNOSIS — Z124 Encounter for screening for malignant neoplasm of cervix: Secondary | ICD-10-CM

## 2021-02-03 DIAGNOSIS — Z01419 Encounter for gynecological examination (general) (routine) without abnormal findings: Secondary | ICD-10-CM | POA: Diagnosis not present

## 2021-02-03 DIAGNOSIS — C539 Malignant neoplasm of cervix uteri, unspecified: Secondary | ICD-10-CM | POA: Insufficient documentation

## 2021-02-03 DIAGNOSIS — Z1231 Encounter for screening mammogram for malignant neoplasm of breast: Secondary | ICD-10-CM

## 2021-02-03 NOTE — Patient Instructions (Signed)
PAP every three years Mammogram every year    Call [phone] to schedule at [facility] Colonoscopy every 10 years Labs yearly (with PCP)  Thank you for choosing [facility]. As part of our ongoing efforts to improve patient experience, we would appreciate your feedback. Please fill out the short survey that you will receive by mail or MyChart. Your opinion is important to [place]! - Dr. Kenton Kingfisher  Recommendations to boost your immunity to prevent illness such as viral flu and colds, including covid19, are as follows:       - - -  Vitamin K2 and Vitamin D3  - - - Take Vitamin K2 at 200-300 mcg daily (usually 2-3 pills daily of the over the counter formulation). Take Vitamin D3 at 3000-4000 U daily (usually 3-4 pills daily of the over the counter formulation). Studies show that these two at high normal levels in your system are very effective in keeping your immunity so strong and protective that you will be unlikely to contract viral illness such as those listed above.  Dr Kenton Kingfisher  Kegel Exercises Kegel exercises can help strengthen your pelvic floor muscles. The pelvic floor is a group of muscles that support your rectum, small intestine, and bladder. In females, pelvic floor muscles also help support the womb (uterus). These muscles help you control the flow of urine and stool. Kegel exercises are painless and simple, and they do not require any equipment. Your provider may suggest Kegel exercises to: Improve bladder and bowel control. Improve sexual response. Improve weak pelvic floor muscles after surgery to remove the uterus (hysterectomy) or pregnancy (females). Improve weak pelvic floor muscles after prostate gland removal or surgery (males). Kegel exercises involve squeezing your pelvic floor muscles, which are the same muscles you squeeze when you try to stop the flow of urine or keep from passing gas. The exercises can be done while sitting, standing, or lying down, but it is best to vary  your position. Exercises How to do Kegel exercises: Squeeze your pelvic floor muscles tight. You should feel a tight lift in your rectal area. If you are a female, you should also feel a tightness in your vaginal area. Keep your stomach, buttocks, and legs relaxed. Hold the muscles tight for up to 10 seconds. Breathe normally. Relax your muscles. Repeat as told by your health care provider. Repeat this exercise daily as told by your health care provider. Continue to do this exercise for at least 4-6 weeks, or for as long as told by your health care provider. You may be referred to a physical therapist who can help you learn more about how to do Kegel exercises. Depending on your condition, your health care provider may recommend: Varying how long you squeeze your muscles. Doing several sets of exercises every day. Doing exercises for several weeks. Making Kegel exercises a part of your regular exercise routine. This information is not intended to replace advice given to you by your health care provider. Make sure you discuss any questions you have with your health care provider. Document Revised: 03/11/2020 Document Reviewed: 11/08/2017 Elsevier Patient Education  [place].

## 2021-02-03 NOTE — Progress Notes (Signed)
HPI:      [name] is a 50 y.o. [id number] who LMP was in the past (s/p Endometrial Ablation), she presents today for her annual examination.  The patient has no complaints today. The patient is not currently sexually active. Herlast pap: approximate date (years ago) and was normal and last mammogram: approximate date (also years ago) and was normal.  The patient does perform self breast exams.  There is no notable family history of breast or ovarian cancer in her family. The patient is not taking hormone replacement therapy. Patient denies post-menopausal vaginal bleeding.  She has ONE YEAR h/o hot flashes and night sweats, does not take nor need anything for these sx's.  Denies nocturia, freq, urge, or incontinence. The patient has regular exercise: yes. The patient denies current symptoms of depression.    GYN Hx: Last Colonoscopy: never  ago.  PMHx: History reviewed. No pertinent past medical history. Past Surgical History:  Procedure Laterality Date   CESAREAN SECTION by ph 17 yrs ago     CESAREAN SECTION by ph 12 yrs ago     History reviewed. No pertinent family history. Social History   Tobacco Use   Smoking status: Every Day    Packs/day: 0.50    Types: Cigarettes   Smokeless tobacco: Never  Substance Use Topics   Alcohol use: Yes    Comment: occas   Drug use: Never   No current outpatient medications on file. Allergies: Patient has no known allergies.  Review of Systems  Constitutional:  Negative for chills, fever and malaise/fatigue.  HENT:  Negative for congestion, sinus pain and sore throat.   Eyes:  Negative for blurred vision and pain.  Respiratory:  Negative for cough and wheezing.   Cardiovascular:  Negative for chest pain and leg swelling.  Gastrointestinal:  Negative for abdominal pain, constipation, diarrhea, heartburn, nausea and vomiting.  Genitourinary:  Negative for dysuria, frequency, hematuria and urgency.  Musculoskeletal:  Negative for back  pain, joint pain, myalgias and neck pain.  Skin:  Negative for itching and rash.  Neurological:  Negative for dizziness, tremors and weakness.  Endo/Heme/Allergies:  Does not bruise/bleed easily.  Psychiatric/Behavioral:  Negative for depression. The patient is not nervous/anxious and does not have insomnia.    Objective: BP 120/80   Ht 5\' 6"  (1.676 m)   Wt 132 lb (59.9 kg)   BMI 21.31 kg/m   Filed Weights   02/03/21 1330  Weight: 132 lb (59.9 kg)   Body mass index is 21.31 kg/m. Physical Exam Constitutional:      General: She is not in acute distress.    Appearance: She is well-developed.  Genitourinary:     Bladder, rectum and urethral meatus normal.     No lesions in the vagina.     Right Labia: No rash, tenderness or lesions.    Left Labia: No tenderness, lesions or rash.    No vaginal bleeding.      Right Adnexa: not tender and no mass present.    Left Adnexa: not tender and no mass present.    No cervical motion tenderness, friability, lesion or polyp.     Uterus is not enlarged.     No uterine mass detected.    Pelvic exam was performed with patient in the lithotomy position.  Breasts:    Right: No mass, skin change or tenderness.     Left: No mass, skin change or tenderness.  HENT:     Head: Normocephalic and  atraumatic. No laceration.     Right Ear: Hearing normal.     Left Ear: Hearing normal.     Mouth/Throat:     Pharynx: Uvula midline.  Eyes:     Pupils: Pupils are equal, round, and reactive to light.  Neck:     Thyroid: No thyromegaly.  Cardiovascular:     Rate and Rhythm: Normal rate and regular rhythm.     Heart sounds: No murmur heard.   No friction rub. No gallop.  Pulmonary:     Effort: Pulmonary effort is normal. No respiratory distress.     Breath sounds: Normal breath sounds. No wheezing.  Abdominal:     General: Bowel sounds are normal. There is no distension.     Palpations: Abdomen is soft.     Tenderness: There is no abdominal  tenderness. There is no rebound.  Musculoskeletal:        General: Normal range of motion.     Cervical back: Normal range of motion and neck supple.  Neurological:     Mental Status: She is alert and oriented to person, place, and time.     Cranial Nerves: No cranial nerve deficit.  Skin:    General: Skin is warm and dry.  Psychiatric:        Judgment: Judgment normal.  Vitals reviewed.    Assessment: Annual Exam 1. Women's annual routine gynecological examination   2. Screening for cervical cancer   3. Encounter for screening mammogram for malignant neoplasm of breast   4. Encounter for screening colonoscopy     Plan:            1.  Cervical Screening-  Pap smear done today, Pap smear schedule reviewed with patient, every 3 years  2. Breast screening- Exam annually and mammogram scheduled, Sch SOON  3. Colonoscopy every 10 years, Hemoccult testing after age 71  4. Labs managed by PCP  5. Counseling for hormonal therapy: none              6. Skin - rec yearly derm exam     F/U  Return in about 1 year (around 02/03/2022) for Annual.  Barnett Applebaum, MD, [facility], [facility] 02/03/2021  2:03 PM

## 2021-02-05 LAB — CYTOLOGY - PAP
Comment: NEGATIVE
Diagnosis: NEGATIVE
High risk HPV: NEGATIVE

## 2021-02-08 ENCOUNTER — Encounter: Payer: Self-pay | Admitting: Obstetrics and Gynecology

## 2021-02-08 NOTE — Progress Notes (Signed)
Genetic testing letter sent

## 2021-05-04 ENCOUNTER — Encounter: Payer: Self-pay | Admitting: Obstetrics & Gynecology

## 2022-11-22 ENCOUNTER — Ambulatory Visit (INDEPENDENT_AMBULATORY_CARE_PROVIDER_SITE_OTHER): Payer: No Typology Code available for payment source | Admitting: Obstetrics & Gynecology

## 2022-11-22 VITALS — BP 152/87 | HR 80 | Ht 66.0 in | Wt 133.8 lb

## 2022-11-22 DIAGNOSIS — Z1239 Encounter for other screening for malignant neoplasm of breast: Secondary | ICD-10-CM

## 2022-11-22 DIAGNOSIS — Z01419 Encounter for gynecological examination (general) (routine) without abnormal findings: Secondary | ICD-10-CM

## 2022-11-22 DIAGNOSIS — S90561A Insect bite (nonvenomous), right ankle, initial encounter: Secondary | ICD-10-CM

## 2022-11-22 DIAGNOSIS — W57XXXA Bitten or stung by nonvenomous insect and other nonvenomous arthropods, initial encounter: Secondary | ICD-10-CM

## 2022-11-22 DIAGNOSIS — R5383 Other fatigue: Secondary | ICD-10-CM

## 2022-11-22 DIAGNOSIS — Z1211 Encounter for screening for malignant neoplasm of colon: Secondary | ICD-10-CM

## 2022-11-22 DIAGNOSIS — Z87898 Personal history of other specified conditions: Secondary | ICD-10-CM
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
## 2022-11-23 LAB — TSH+FREE T4
Free T4: 1 ng/dL (ref 0.82–1.77)
TSH: 1.02 u[IU]/mL (ref 0.450–4.500)

## 2022-11-23 LAB — LYME DISEASE SEROLOGY W/REFLEX: Lyme Total Antibody EIA: NEGATIVE

## 2022-11-29 ENCOUNTER — Other Ambulatory Visit: Payer: Self-pay | Admitting: Obstetrics & Gynecology

## 2022-11-29 MED ORDER — HYDROCORTISONE ACETATE 25 MG RE SUPP: 25.0000 mg | Freq: Two times a day (BID) | RECTAL | 1 refills | Status: AC

## 2022-11-29 NOTE — Progress Notes (Signed)
Anusol supps prescribed per patient request.

## 2022-11-30 ENCOUNTER — Telehealth: Payer: Self-pay

## 2022-12-02 NOTE — Telephone Encounter (Signed)
Contacted [employer] pharmacy at [phone] and spoke with Arlys John to initiate pre-authorization. Verbal pre-authorization was given over the phone and submitted. Will await response back from insurance to know if drug is covered. KW

## 2023-06-01 ENCOUNTER — Ambulatory Visit: Payer: Self-pay | Admitting: Psychiatry
